# Patient Record
Sex: Male | Born: 1990 | Hispanic: Yes | Marital: Married | Smoking: Current every day smoker
Health system: Southern US, Community
[De-identification: ages and names within clinical notes are randomized; demographics above are authoritative.]

## PROBLEM LIST (undated history)

## (undated) DIAGNOSIS — G8929 Other chronic pain: Secondary | ICD-10-CM

## (undated) HISTORY — DX: Other chronic pain: G89.29

## (undated) HISTORY — PX: APPENDECTOMY: SHX54

---

## 2018-07-08 ENCOUNTER — Encounter (HOSPITAL_COMMUNITY): Payer: Self-pay | Admitting: *Deleted

## 2018-07-08 ENCOUNTER — Emergency Department (HOSPITAL_COMMUNITY)
Admission: EM | Admit: 2018-07-08 | Discharge: 2018-07-08 | Disposition: A | Discharge status: Home / Self Care | Payer: Self-pay | Attending: Emergency Medicine | Admitting: Emergency Medicine

## 2018-07-08 ENCOUNTER — Emergency Department (HOSPITAL_COMMUNITY): Payer: Self-pay

## 2018-07-08 DIAGNOSIS — F17200 Nicotine dependence, unspecified, uncomplicated: Secondary | ICD-10-CM | POA: Insufficient documentation

## 2018-07-08 DIAGNOSIS — Y929 Unspecified place or not applicable: Secondary | ICD-10-CM | POA: Insufficient documentation

## 2018-07-08 DIAGNOSIS — Y990 Civilian activity done for income or pay: Secondary | ICD-10-CM | POA: Insufficient documentation

## 2018-07-08 DIAGNOSIS — Z23 Encounter for immunization: Secondary | ICD-10-CM | POA: Insufficient documentation

## 2018-07-08 DIAGNOSIS — Y93K9 Activity, other involving animal care: Secondary | ICD-10-CM | POA: Insufficient documentation

## 2018-07-08 DIAGNOSIS — S61217A Laceration without foreign body of left little finger without damage to nail, initial encounter: Secondary | ICD-10-CM | POA: Insufficient documentation

## 2018-07-08 DIAGNOSIS — W3189XA Contact with other specified machinery, initial encounter: Secondary | ICD-10-CM | POA: Insufficient documentation

## 2018-07-08 MED ORDER — BUPIVACAINE HCL (PF) 0.5 % IJ SOLN
30.0000 mL | Freq: Once | INTRAMUSCULAR | Status: AC
  Administered 2018-07-08: 30 mL
  Filled 2018-07-08: qty 30

## 2018-07-08 MED ORDER — CEFAZOLIN SODIUM-DEXTROSE 2-4 GM/100ML-% IV SOLN
2.0000 g | Freq: Once | INTRAVENOUS | Status: AC
  Administered 2018-07-08: 2 g via INTRAVENOUS
  Filled 2018-07-08: qty 100

## 2018-07-08 MED ORDER — BUPIVACAINE HCL 0.5 % IJ SOLN: 50.0000 mL | Freq: Once | INTRAMUSCULAR | Status: DC

## 2018-07-08 MED ORDER — DOXYCYCLINE HYCLATE 100 MG PO CAPS: 100.0000 mg | ORAL_CAPSULE | Freq: Two times a day (BID) | ORAL | 0 refills | Status: DC

## 2018-07-08 MED ORDER — LIDOCAINE HCL 2 % IJ SOLN
20.0000 mL | Freq: Once | INTRAMUSCULAR | Status: AC
  Administered 2018-07-08: 400 mg
  Filled 2018-07-08: qty 20

## 2018-07-08 MED ORDER — TETANUS-DIPHTH-ACELL PERTUSSIS 5-2.5-18.5 LF-MCG/0.5 IM SUSP
0.5000 mL | Freq: Once | INTRAMUSCULAR | Status: AC
  Administered 2018-07-08: 0.5 mL via INTRAMUSCULAR
  Filled 2018-07-08: qty 0.5

## 2018-07-08 NOTE — ED Notes (Signed)
Splint applied to L pinky finger per EDP

## 2018-07-08 NOTE — ED Triage Notes (Signed)
Pt in with injury to his left 5th finger, states it got caught in a machine at work, dressing in place on arrival, bleeding controlled

## 2018-07-08 NOTE — ED Provider Notes (Signed)
[facility] EMERGENCY DEPARTMENT Provider Note   CSN: [id number] Arrival date & time: 07/08/18  1138   History   Chief Complaint Chief Complaint  Patient presents with  . Finger Injury    HPI [facility] Wagar is a 27 y.o. male.  HPI   77 YOM presents today with complaints of finger injury. 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 He notes no loss of sensation strength or motor function. Uncertain tetanus status.   History reviewed. No pertinent past medical history.  There are no active problems to display for this patient.   History reviewed. No pertinent surgical history.      Home Medications    Prior to Admission medications   Medication Sig Start Date End Date Taking? Authorizing Provider  doxycycline (VIBRAMYCIN) 100 MG capsule Take 1 capsule (100 mg total) by mouth 2 (two) times daily. 07/08/18   Eyvonne Mechanic, PA-C    Family History History reviewed. No pertinent family history.  Social History Social History   Tobacco Use  . Smoking status: Current Every Day Smoker  . Smokeless tobacco: Never Used  Substance Use Topics  . Alcohol use: Not on file  . Drug use: Not on file     Allergies   Patient has no known allergies.   Review of Systems Review of Systems  All other systems reviewed and are negative.    Physical Exam Updated Vital Signs BP (!) 140/103   Pulse 76   Temp 98 F (36.7 C) (Oral)   Resp 18   SpO2 100%   Physical Exam  Constitutional: He is oriented to person, place, and time. He appears well-developed and well-nourished.  HENT:  Head: Normocephalic and atraumatic.  Eyes: Pupils are equal, round, and reactive to light. Conjunctivae are normal. Right eye exhibits no discharge. Left eye exhibits no discharge. No scleral icterus.  Neck: Normal range of motion. No JVD present. No tracheal deviation present.    Pulmonary/Chest: Effort normal. No stridor.  Musculoskeletal:  Macerated tissue noted over the left 5th dorsum finger at the PIP. Small bone fragment noted, defect noted on the medial extensor tendon- full active range of motion against resistance at the MCP, PIP and DIP- sensation intact - superficial skin loss noted at the distal pad 5th digit   Neurological: He is alert and oriented to person, place, and time. Coordination normal.  Psychiatric: He has a normal mood and affect. His behavior is normal. Judgment and thought content normal.  Nursing note and vitals reviewed.    ED Treatments / Results  Labs (all labs ordered are listed, but only abnormal results are displayed) Labs Reviewed - No data to display  EKG None  Radiology Dg Finger Little Left  Result Date: 07/08/2018 CLINICAL DATA:  Left fifth finger injury at work. EXAM: LEFT LITTLE FINGER 2+V COMPARISON:  None. FINDINGS: Crescent-shaped bone fragment is seen posterior to proximal base of fifth middle phalanx concerning for fracture. Joint spaces are intact. Dressing is noted around the finger. IMPRESSION: Crescent-shaped bone fragment seen posterior to proximal base of fifth middle phalanx concerning for fracture. Electronically Signed   By: Lupita Raider, M.D.   On: 07/08/2018 12:44    Procedures Procedures (including critical care time)  Medications Ordered in ED Medications  lidocaine (XYLOCAINE) 2 % (with pres) injection 400 mg (400 mg Infiltration Given by Other 07/08/18 1315)  bupivacaine (  MARCAINE) 0.5 % injection 30 mL (30 mLs Infiltration Given by Other 07/08/18 1330)  ceFAZolin (ANCEF) IVPB 2g/100 mL premix (0 g Intravenous Stopped 07/08/18 1448)  Tdap (BOOSTRIX) injection 0.5 mL (0.5 mLs Intramuscular Given 07/08/18 1453)     Initial Impression / Assessment and Plan / ED Course  I have reviewed the triage vital signs and the nursing notes.  Pertinent labs & imaging results that were available during my  care of the patient were reviewed by me and considered in my medical decision making (see chart for details).     Labs:   Imaging: DG finger left little   Consults:  Therapeutics: Tdap, lidocaine, bupivacaine, Ancef   Discharge Meds: Doxycycline  Assessment/Plan: 27 year old male presents today with laceration to his finger.  Patient notes to have very small amount of tendon involvement and bone fragment.  Wound was copiously irrigated and closed, patient has no loss of sensation strength and motor function of the finger.  Case was discussed with orthopedics with outpatient follow-up indicated.  Patient is given strict return precautions, wound care instructions.  Verbalized understanding and agreement to today's plan.  Interpreter was used.     Final Clinical Impressions(s) / ED Diagnoses   Final diagnoses:  Laceration of left little finger without foreign body without damage to nail, initial encounter    ED Discharge Orders         Ordered    doxycycline (VIBRAMYCIN) 100 MG capsule  2 times daily     07/08/18 1539           Nyesha Cliff, Tinnie Gens, PA-C 07/08/18 1557    Derwood Kaplan, MD 07/11/18 1504

## 2018-07-08 NOTE — Discharge Instructions (Signed)
Please read attached information. If you experience any new or worsening signs or symptoms please return to the emergency room for evaluation. Please follow-up with your primary care provider or specialist as discussed. Please use medication prescribed only as directed and discontinue taking if you have any concerning signs or symptoms.   °

## 2019-12-03 IMAGING — CR DG FINGER LITTLE 2+V*L*
3 series · 3 of 3 positions shown · non-contrast
Comparison: None.

CLINICAL DATA: Left fifth finger injury at work.

EXAM:
LEFT LITTLE FINGER 2+V

[finger ap]
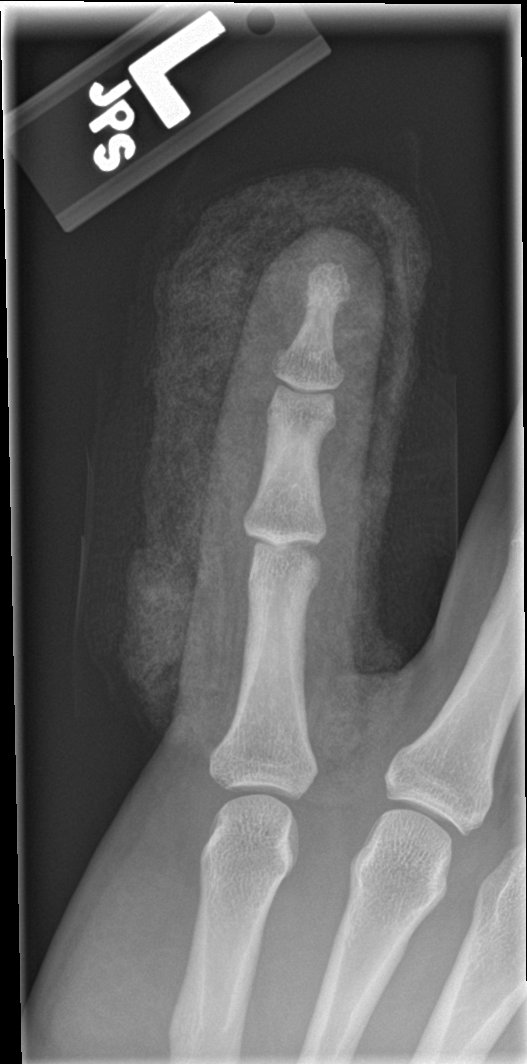

[finger obl]
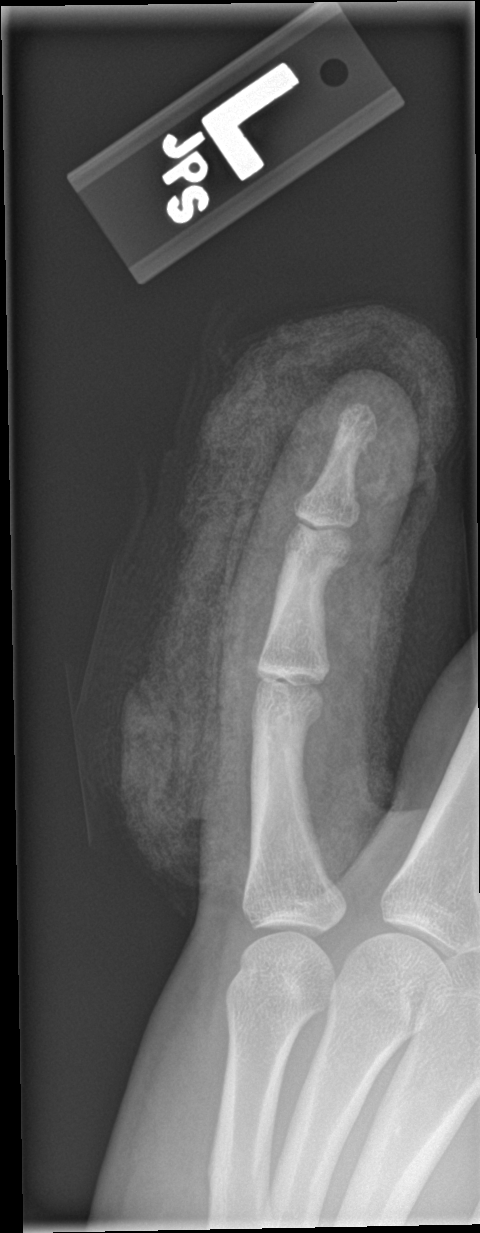

[finger lat]
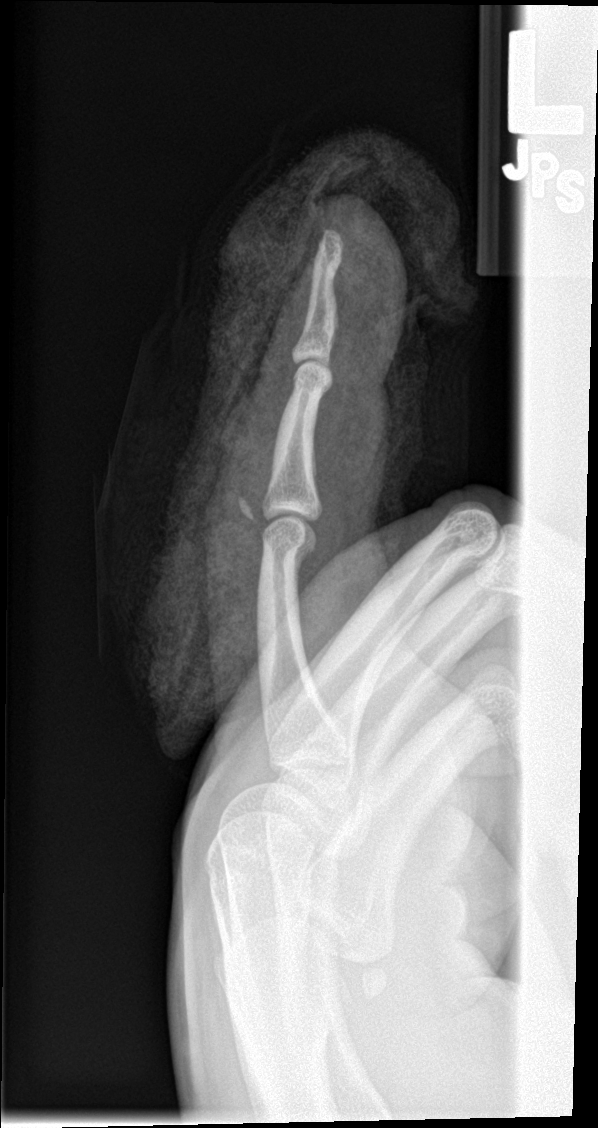

[3 of 3 positions shown; findings below may reference images not displayed]

FINDINGS: Crescent-shaped bone fragment is seen posterior to proximal base of
fifth middle phalanx concerning for fracture. Joint spaces are
intact. Dressing is noted around the finger.
IMPRESSION: Crescent-shaped bone fragment seen posterior to proximal base of
fifth middle phalanx concerning for fracture.

## 2022-09-09 ENCOUNTER — Inpatient Hospital Stay (HOSPITAL_COMMUNITY)
Admission: EM | Admit: 2022-09-09 | Discharge: 2022-09-13 | DRG: 519 | Disposition: A | Discharge status: Home / Self Care | Payer: Self-pay | Attending: Neurosurgery | Admitting: Neurosurgery

## 2022-09-09 ENCOUNTER — Encounter (HOSPITAL_COMMUNITY): Payer: Self-pay

## 2022-09-09 ENCOUNTER — Other Ambulatory Visit: Payer: Self-pay

## 2022-09-09 DIAGNOSIS — Z6841 Body Mass Index (BMI) 40.0 and over, adult: Secondary | ICD-10-CM

## 2022-09-09 DIAGNOSIS — M5136 Other intervertebral disc degeneration, lumbar region: Principal | ICD-10-CM

## 2022-09-09 DIAGNOSIS — M5416 Radiculopathy, lumbar region: Secondary | ICD-10-CM

## 2022-09-09 DIAGNOSIS — F17210 Nicotine dependence, cigarettes, uncomplicated: Secondary | ICD-10-CM | POA: Diagnosis present

## 2022-09-09 DIAGNOSIS — M5116 Intervertebral disc disorders with radiculopathy, lumbar region: Principal | ICD-10-CM | POA: Diagnosis present

## 2022-09-09 DIAGNOSIS — M51369 Other intervertebral disc degeneration, lumbar region without mention of lumbar back pain or lower extremity pain: Secondary | ICD-10-CM

## 2022-09-09 DIAGNOSIS — G8929 Other chronic pain: Secondary | ICD-10-CM | POA: Diagnosis present

## 2022-09-09 DIAGNOSIS — Z79891 Long term (current) use of opiate analgesic: Secondary | ICD-10-CM

## 2022-09-09 DIAGNOSIS — M549 Dorsalgia, unspecified: Secondary | ICD-10-CM

## 2022-09-09 DIAGNOSIS — Z79899 Other long term (current) drug therapy: Secondary | ICD-10-CM

## 2022-09-09 MED ORDER — NALOXONE HCL 0.4 MG/ML IJ SOLN: 0.4000 mg | Freq: Once | INTRAMUSCULAR | Status: DC

## 2022-09-09 MED ORDER — HYDROCODONE-ACETAMINOPHEN 5-325 MG PO TABS
1.0000 | ORAL_TABLET | Freq: Once | ORAL | Status: AC
  Administered 2022-09-09: 1 via ORAL
  Filled 2022-09-09: qty 1

## 2022-09-09 MED ORDER — LIDOCAINE 5 % EX PTCH
1.0000 | MEDICATED_PATCH | CUTANEOUS | Status: DC
  Administered 2022-09-09 – 2022-09-12 (×4): 1 via TRANSDERMAL
  Filled 2022-09-09 (×5): qty 1

## 2022-09-09 NOTE — ED Provider Triage Note (Signed)
Emergency Medicine Provider Triage Evaluation Note  [facility] Marc York , a 31 y.o. male  was evaluated in triage.  Pt complains of chronic back pain with worsening pain for the last 3 days. States pain is unbearable. Primarily affects right buttocks and leg. No loss of bowel, bladder, fever, or chills. No recent trauma. On ibuprofen, flexeril, w/o relief.  Review of Systems  Positive: Leg pain, buttocks pain Negative: Loss of bowel, bladder  Physical Exam  BP (!) 134/96   Pulse 92   Temp 98.2 F (36.8 C) (Oral)   Resp 12   SpO2 98%  Gen:   Awake, no distress   Resp:  Normal effort  MSK:   Moves extremities without difficulty  Other:  +R SLR  Medical Decision Making  Medically screening exam initiated at 3:47 PM.  Appropriate orders placed.  [facility] Marc York was informed that the remainder of the evaluation will be completed by another provider, this initial triage assessment does not replace that evaluation, and the importance of remaining in the ED until their evaluation is complete.    Pete Pelt, [place] 09/09/22 1548

## 2022-09-09 NOTE — ED Triage Notes (Signed)
Per pt's wife who is interpreting for the patient. Pt sees [facility] for chronic low back pain.   Today, pt is in so much pain he can't walk. Pain starts at the right lower back and extends down the lateral aspect of the leg to his foot. Pt has taken his pain regimen that is prescribed to him with no relief. Denies new injury.  Imaging last done in 02/2022.

## 2022-09-10 ENCOUNTER — Emergency Department (HOSPITAL_COMMUNITY): Payer: Self-pay

## 2022-09-10 DIAGNOSIS — M549 Dorsalgia, unspecified: Secondary | ICD-10-CM | POA: Diagnosis present

## 2022-09-10 DIAGNOSIS — M5416 Radiculopathy, lumbar region: Secondary | ICD-10-CM | POA: Diagnosis present

## 2022-09-10 LAB — CBC
HCT: 46 % (ref 39.0–52.0)
Hemoglobin: 16.6 g/dL (ref 13.0–17.0)
MCH: 32.2 pg (ref 26.0–34.0)
MCHC: 36.1 g/dL — ABNORMAL HIGH (ref 30.0–36.0)
MCV: 89.1 fL (ref 80.0–100.0)
Platelets: 261 10*3/uL (ref 150–400)
RBC: 5.16 MIL/uL (ref 4.22–5.81)
RDW: 12.7 % (ref 11.5–15.5)
WBC: 12.1 10*3/uL — ABNORMAL HIGH (ref 4.0–10.5)
nRBC: 0 % (ref 0.0–0.2)

## 2022-09-10 LAB — BASIC METABOLIC PANEL
Anion gap: 11 (ref 5–15)
BUN: 13 mg/dL (ref 6–20)
CO2: 24 mmol/L (ref 22–32)
Calcium: 9.9 mg/dL (ref 8.9–10.3)
Chloride: 102 mmol/L (ref 98–111)
Creatinine, Ser: 1.05 mg/dL (ref 0.61–1.24)
GFR, Estimated: >60 mL/min (ref >60)
Glucose, Bld: 119 mg/dL — ABNORMAL HIGH (ref 70–99)
Potassium: 4 mmol/L (ref 3.5–5.1)
Sodium: 137 mmol/L (ref 135–145)

## 2022-09-10 MED ORDER — HYDROMORPHONE HCL 1 MG/ML IJ SOLN
1.0000 mg | Freq: Once | INTRAMUSCULAR | Status: AC
  Administered 2022-09-10: 1 mg via INTRAVENOUS
  Filled 2022-09-10: qty 1

## 2022-09-10 MED ORDER — CYCLOBENZAPRINE HCL 10 MG PO TABS
10.0000 mg | ORAL_TABLET | Freq: Three times a day (TID) | ORAL | Status: DC | PRN
  Filled 2022-09-10: qty 1

## 2022-09-10 MED ORDER — DOCUSATE SODIUM 100 MG PO CAPS
100.0000 mg | ORAL_CAPSULE | Freq: Two times a day (BID) | ORAL | Status: DC
  Filled 2022-09-10 (×4): qty 1

## 2022-09-10 MED ORDER — ACETAMINOPHEN 325 MG PO TABS: 650.0000 mg | ORAL_TABLET | ORAL | Status: DC | PRN

## 2022-09-10 MED ORDER — ONDANSETRON HCL 4 MG/2ML IJ SOLN: 4.0000 mg | Freq: Four times a day (QID) | INTRAMUSCULAR | Status: DC | PRN

## 2022-09-10 MED ORDER — CELECOXIB 200 MG PO CAPS
200.0000 mg | ORAL_CAPSULE | Freq: Two times a day (BID) | ORAL | Status: DC
  Administered 2022-09-10 – 2022-09-13 (×6): 200 mg via ORAL
  Filled 2022-09-10 (×7): qty 1

## 2022-09-10 MED ORDER — ACETAMINOPHEN 650 MG RE SUPP: 650.0000 mg | RECTAL | Status: DC | PRN

## 2022-09-10 MED ORDER — SODIUM CHLORIDE 0.9% FLUSH: 3.0000 mL | INTRAVENOUS | Status: DC | PRN

## 2022-09-10 MED ORDER — POLYETHYLENE GLYCOL 3350 17 G PO PACK: 17.0000 g | PACK | Freq: Every day | ORAL | Status: DC | PRN

## 2022-09-10 MED ORDER — HYDROMORPHONE HCL 1 MG/ML IJ SOLN: 1.0000 mg | INTRAMUSCULAR | Status: DC | PRN

## 2022-09-10 MED ORDER — SODIUM CHLORIDE 0.9 % IV SOLN
250.0000 mL | INTRAVENOUS | Status: DC
  Administered 2022-09-10: 250 mL via INTRAVENOUS

## 2022-09-10 MED ORDER — OXYCODONE HCL 5 MG PO TABS
5.0000 mg | ORAL_TABLET | ORAL | Status: DC | PRN
  Administered 2022-09-11: 5 mg via ORAL
  Filled 2022-09-10 (×2): qty 1

## 2022-09-10 MED ORDER — ONDANSETRON HCL 4 MG PO TABS: 4.0000 mg | ORAL_TABLET | Freq: Four times a day (QID) | ORAL | Status: DC | PRN

## 2022-09-10 MED ORDER — MENTHOL 3 MG MT LOZG: 1.0000 | LOZENGE | OROMUCOSAL | Status: DC | PRN

## 2022-09-10 MED ORDER — PHENOL 1.4 % MT LIQD: 1.0000 | OROMUCOSAL | Status: DC | PRN

## 2022-09-10 MED ORDER — ENOXAPARIN SODIUM 40 MG/0.4ML IJ SOSY
40.0000 mg | PREFILLED_SYRINGE | INTRAMUSCULAR | Status: DC
  Administered 2022-09-11 – 2022-09-13 (×2): 40 mg via SUBCUTANEOUS
  Filled 2022-09-10 (×2): qty 0.4

## 2022-09-10 MED ORDER — SODIUM CHLORIDE 0.9% FLUSH
3.0000 mL | Freq: Two times a day (BID) | INTRAVENOUS | Status: DC
  Administered 2022-09-10 – 2022-09-11 (×4): 3 mL via INTRAVENOUS

## 2022-09-10 MED ORDER — FLEET ENEMA 7-19 GM/118ML RE ENEM: 1.0000 | ENEMA | Freq: Once | RECTAL | Status: DC | PRN

## 2022-09-10 MED ORDER — HYDROCODONE-ACETAMINOPHEN 5-325 MG PO TABS
1.0000 | ORAL_TABLET | Freq: Once | ORAL | Status: AC
  Administered 2022-09-10: 1 via ORAL
  Filled 2022-09-10: qty 1

## 2022-09-10 MED ORDER — PREDNISONE 20 MG PO TABS
60.0000 mg | ORAL_TABLET | Freq: Once | ORAL | Status: AC
  Administered 2022-09-10: 60 mg via ORAL
  Filled 2022-09-10: qty 3

## 2022-09-10 NOTE — ED Notes (Signed)
PT well appearing upon transport to floor.  

## 2022-09-10 NOTE — ED Notes (Signed)
This RN assumed care of patient. He denies any unmet needs at this time and NAD noted. Transfer of care report given by previous RN to inpatient RN, Waiting for floor to accept patient.

## 2022-09-10 NOTE — H&P (Signed)
CC: back and right leg pain  HPI:     Patient is a 31 y.o. male presents with worsening right leg sciatica.  He has had back and pain radiating down his right leg for the past several months.  PT and pain management failed to resolve his symptoms.  He then went to [facility] and received an ESI which helped only for a few days. He came to the ER when he felt more weakness in his leg.    Patient Active Problem List   Diagnosis Date Noted   Back pain 09/10/2022   Lumbar radiculopathy 09/10/2022   Past Medical History:  Diagnosis Date   Chronic back pain     History reviewed. No pertinent surgical history.  Medications Prior to Admission  Medication Sig Dispense Refill Last Dose   cyclobenzaprine (FLEXERIL) 10 MG tablet 10 mg 2 (two) times daily as needed for muscle spasms.   09/09/2022   gabapentin (NEURONTIN) 300 MG capsule Take 300 mg by mouth 2 (two) times daily.   09/09/2022   ibuprofen (ADVIL) 600 MG tablet Take 600 mg by mouth every 8 (eight) hours as needed for moderate pain.   09/09/2022   traMADol (ULTRAM) 50 MG tablet Take 50 mg by mouth 2 (two) times daily as needed for moderate pain.   09/09/2022   No Known Allergies  Social History   Tobacco Use   Smoking status: Every Day   Smokeless tobacco: Never  Substance Use Topics   Alcohol use: Not Currently    History reviewed. No pertinent family history.   Review of Systems Pertinent items noted in HPI and remainder of comprehensive ROS otherwise negative.  Objective:   Patient Vitals for the past 8 hrs:  BP Temp Temp src Pulse Resp SpO2  09/10/22 1015 (!) 140/83 98.5 F (36.9 C) -- 75 18 97 %  09/10/22 0407 139/80 98.2 F (36.8 C) Oral 90 18 99 %   No intake/output data recorded. No intake/output data recorded.      General : Alert, cooperative, no distress, appears stated age. obese   Head:  Normocephalic/atraumatic    Eyes: PERRL, conjunctiva/corneas clear, EOM's intact. Fundi could not be  visualized Neck: Supple Chest:  Respirations unlabored Chest wall: no tenderness or deformity Heart: Regular rate and rhythm Abdomen: Soft, nontender and nondistended Extremities: warm and well-perfused Skin: normal turgor, color and texture Neurologic:  Alert, oriented x 3.  Eyes open spontaneously. PERRL, EOMI, VFC, no facial droop. V1-3 intact.  No dysarthria, tongue protrusion symmetric.  CNII-XII intact. Normal strength, sensation and reflexes throughout.  No pronator drift, full strength in legs, except R DF 4+/5, R EHL 4-/5.  + R SLR       Data ReviewCBC:  Lab Results  Component Value Date   WBC 12.1 (H) 09/10/2022   RBC 5.16 09/10/2022   BMP:  Lab Results  Component Value Date   GLUCOSE 119 (H) 09/10/2022   CO2 24 09/10/2022   BUN 13 09/10/2022   CREATININE 1.05 09/10/2022   CALCIUM 9.9 09/10/2022   Radiology review:   MRI L spine reviewed.  Bulging disc at L3-4 with mild/moderate stenosis.  Large L4-5 broad R paracentral disc herniation with inferior extrusion and severe nerve impingement.  Assessment:   Principal Problem:   Back pain Active Problems:   Lumbar radiculopathy   Plan:  -  I had a long discussion with the patient and his wife.  Treatment options were discussed.  They wished to  proceed with microdiscectomy.  I made them aware that [facility] has low OR availability for inpatient surgeries so it may be several days before it can be scheduled.  Risks, benefits, alternatives, and expected convalescence were discussed with her.  Risks discussed included, but were not limited to bleeding, pain, infection, scar, spinal fluid leak, neurologic deficit, instability, damage to nearby organs, and death.  Informed consent was obtained.

## 2022-09-10 NOTE — Plan of Care (Signed)
  Problem: Education: Goal: Knowledge of General Education information will improve Description: Including pain rating scale, medication(s)/side effects and non-pharmacologic comfort measures Outcome: Progressing   Problem: Activity: Goal: Risk for activity intolerance will decrease Outcome: Progressing   Problem: Nutrition: Goal: Adequate nutrition will be maintained Outcome: Progressing   Problem: Elimination: Goal: Will not experience complications related to bowel motility Outcome: Progressing   Problem: Pain Managment: Goal: General experience of comfort will improve Outcome: Progressing   

## 2022-09-10 NOTE — ED Notes (Signed)
Pt family came to see what was the current wait time stating that the pt was still in a lot of pain and earlier intervention did not help. Wait was explained tp pts family. PA was notified

## 2022-09-10 NOTE — ED Provider Notes (Signed)
[facility] EMERGENCY DEPARTMENT Provider Note   CSN: [id number] Arrival date & time: 09/09/22  1351     History Chief Complaint  Patient presents with   Back Pain    Marc York is a 31 y.o. male.   Back Pain Associated symptoms: numbness and weakness   Associated symptoms: no abdominal pain, no dysuria and no fever   Patient presents to the ER with worsening radiating low back pain. Patient states that pain originally began about 6 months ago after a fall with resolution in symptoms after course of antiinflammatories. Patient states that current low back pain has been worsening for several days but worsened significantly this morning with no relief in symptoms even with use of prescribed medications for pain control. Taking ibuprofen, gabapentin, cyclobenzaprine, and diclofenac. Denies fever, abdominal pain, urinary symptoms, loss of bowel or bladder control, or saddle paraesthesia.   Home Medications Prior to Admission medications   Medication Sig Start Date End Date Taking? Authorizing Provider  cyclobenzaprine (FLEXERIL) 10 MG tablet 10 mg 2 (two) times daily as needed for muscle spasms. 08/09/22  Yes [provider]  gabapentin (NEURONTIN) 300 MG capsule Take 300 mg by mouth 2 (two) times daily. 07/08/22  Yes [provider]  ibuprofen (ADVIL) 600 MG tablet Take 600 mg by mouth every 8 (eight) hours as needed for moderate pain.   Yes [provider]  traMADol (ULTRAM) 50 MG tablet Take 50 mg by mouth 2 (two) times daily as needed for moderate pain. 07/30/16  Yes [provider]      Allergies    Patient has no known allergies.    Review of Systems   Review of Systems  Constitutional:  Negative for chills and fever.  Cardiovascular:  Negative for leg swelling.  Gastrointestinal:  Negative for abdominal pain and nausea.  Genitourinary:  Negative for dysuria and hematuria.  Musculoskeletal:  Positive  for back pain.  Neurological:  Positive for weakness and numbness.  All other systems reviewed and are negative.   Physical Exam Updated Vital Signs BP 139/80   Pulse 90   Temp 98.2 F (36.8 C) (Oral)   Resp 18   SpO2 99%  Physical Exam Vitals and nursing note reviewed.  Constitutional:      Appearance: Normal appearance.  HENT:     Head: Normocephalic and atraumatic.  Eyes:     Conjunctiva/sclera: Conjunctivae normal.  Cardiovascular:     Rate and Rhythm: Normal rate and regular rhythm.     Pulses: Normal pulses.     Heart sounds: Normal heart sounds.  Pulmonary:     Effort: Pulmonary effort is normal.     Breath sounds: Normal breath sounds.  Musculoskeletal:        General: Tenderness present. No swelling or deformity.     Cervical back: Normal range of motion.     Right lower leg: No edema.     Left lower leg: No edema.  Skin:    General: Skin is warm and dry.  Neurological:     Mental Status: He is alert.     Sensory: Sensory deficit present.     Motor: Weakness present.     Deep Tendon Reflexes: Reflexes abnormal.     Reflex Scores:      Patellar reflexes are 2+ on the right side and 2+ on the left side.      Achilles reflexes are 1+ on the right side and 2+ on the left side.  Comments: Decreased strength in right leg compared to left. Notable decreased strength on resisted leg extension/flexion, plantarflexion. Patient unable to stand on toes with right foot. Left leg/foot normal.     ED Results / Procedures / Treatments   Labs (all labs ordered are listed, but only abnormal results are displayed) Labs Reviewed  CBC - Abnormal; Notable for the following components:      Result Value   WBC 12.1 (*)    MCHC 36.1 (*)    All other components within normal limits  BASIC METABOLIC PANEL - Abnormal; Notable for the following components:   Glucose, Bld 119 (*)    All other components within normal limits    EKG None  Radiology MR LUMBAR SPINE WO  CONTRAST  Result Date: 09/10/2022 CLINICAL DATA:  Acute lumbar myelopathy. Right leg pain extending to the foot EXAM: MRI LUMBAR SPINE WITHOUT CONTRAST TECHNIQUE: Multiplanar, multisequence MR imaging of the lumbar spine was performed. No intravenous contrast was administered. COMPARISON:  None Available. FINDINGS: Segmentation:  Standard. Alignment:  Reversal of lumbar lordosis Vertebrae:  No fracture, evidence of discitis, or bone lesion. Conus medullaris and cauda equina: Conus extends to the T12-L1 level. Conus and cauda equina appear normal. Paraspinal and other soft tissues: Negative for perispinal mass or inflammation Disc levels: T12- L1: Unremarkable. L1-L2: Central herniation without cauda equina compression L2-L3: Disc space narrowing and circumferential bulging. L3-L4: Disc narrowing and bulging with bilateral foraminal and right paracentral herniation. The right paracentral herniation could affect the descending right L4 nerve root. Patent foramina with mild to moderate spinal stenosis L4-L5: Disc narrowing and bulging with right paracentral downward migrating extrusion markedly compressing the right L5 nerve root. Compression of the thecal sac. Patent foramina L5-S1:Small central protrusion which is noncompressive. IMPRESSION: 1. Multilevel disc degeneration and herniation as described. 2. L4-5 right paracentral extrusion with downward migration and marked right L5 impingement. Thecal sac compression at this level. 3. L3-4 right paracentral herniation that could affect the right L4 nerve root. 4. Diffusely patent foramina. Electronically Signed   By: Tiburcio Pea M.D.   On: 09/10/2022 04:37    Procedures Procedures   Medications Ordered in ED Medications  lidocaine (LIDODERM) 5 % 1 patch (1 patch Transdermal Patch Applied 09/09/22 1616)  sodium chloride flush (NS) 0.9 % injection 3 mL (has no administration in time range)  sodium chloride flush (NS) 0.9 % injection 3 mL (has no  administration in time range)  0.9 %  sodium chloride infusion (250 mLs Intravenous New Bag/Given 09/10/22 3419)  acetaminophen (TYLENOL) tablet 650 mg (has no administration in time range)    Or  acetaminophen (TYLENOL) suppository 650 mg (has no administration in time range)  ondansetron (ZOFRAN) tablet 4 mg (has no administration in time range)    Or  ondansetron (ZOFRAN) injection 4 mg (has no administration in time range)  menthol-cetylpyridinium (CEPACOL) lozenge 3 mg (has no administration in time range)    Or  phenol (CHLORASEPTIC) mouth spray 1 spray (has no administration in time range)  enoxaparin (LOVENOX) injection 40 mg (has no administration in time range)  celecoxib (CELEBREX) capsule 200 mg (has no administration in time range)  oxyCODONE (Oxy IR/ROXICODONE) immediate release tablet 5 mg (has no administration in time range)  HYDROmorphone (DILAUDID) injection 1 mg (has no administration in time range)  cyclobenzaprine (FLEXERIL) tablet 10 mg (has no administration in time range)  docusate sodium (COLACE) capsule 100 mg (has no administration in time range)  polyethylene glycol (MIRALAX /  GLYCOLAX) packet 17 g (has no administration in time range)  sodium phosphate (FLEET) 7-19 GM/118ML enema 1 enema (has no administration in time range)  HYDROcodone-acetaminophen (NORCO/VICODIN) 5-325 MG per tablet 1 tablet (1 tablet Oral Given 09/09/22 1616)  HYDROcodone-acetaminophen (NORCO/VICODIN) 5-325 MG per tablet 1 tablet (1 tablet Oral Given 09/10/22 0102)  HYDROmorphone (DILAUDID) injection 1 mg (1 mg Intravenous Given 09/10/22 0358)  predniSONE (DELTASONE) tablet 60 mg (60 mg Oral Given 09/10/22 0358)    ED Course/ Medical Decision Making/ A&P                           Medical Decision Making Amount and/or Complexity of Data Reviewed Labs: ordered. Radiology: ordered.  Risk Prescription drug management. Decision regarding hospitalization.   This patient presents to  the ED for concern of low back pain with numbness. Differential diagnosis includes lumbar radiculopathy, spinal abscess, cauda equina syndrome, spinal stenosis, neuropathy   Additional history obtained:  Additional history obtained from wife\ Patient's wife provided a copy of MRI from 6 months ago on disc that could not be viewed.   Imaging Studies ordered:  I ordered imaging studies including MRI lumbar spine  I independently visualized and interpreted imaging which showed L4-5 right extrusion with L5 impingement. L3-4 right paracentral herniation I agree with the radiologist interpretation   Medicines ordered and prescription drug management:  I ordered medication including Dilaudid and prednisone for low back pain with radiculopathy Reevaluation of the patient after these medicines showed that the patient improved I have reviewed the patients home medicines and have made adjustments as needed   Problem List / ED Course:  Patient presented to the ER due to severe low back pain with numbness. Patient reports that symptoms originally began about 6 months ago with 6-8 week period of similar pain that eventually resolved with use of antiinflammatories. Patient's current episode of back pain and numbness has been worsening over the past 3 days and does not feel that current medications prescribed by [facility] are helping symptoms. Exam was concerning for right leg weakness noted on leg extension/flexion as well as significantly strength on plantarflexion with inability to stand on toes on right side with all of these findings difficult to distinguish if limited by pain or truly a neurological deficit. Dr. Pilar Plate consulted neurosurgery which requested inpatient admission for further evaluation and possible microdiscectomy. All of this was communicated to patient and he verbalized understanding.    Final Clinical Impression(s) / ED Diagnoses Final diagnoses:  Bulging of  intervertebral disc between L4 and L5  Lumbar radiculopathy    Rx / DC Orders ED Discharge Orders          Ordered    Incentive spirometry RT        09/10/22 0540              Smitty Knudsen, PA-C 09/10/22 0732    Sabas Sous, MD 09/11/22 [phone]

## 2022-09-11 LAB — SURGICAL PCR SCREEN
MRSA, PCR: NEGATIVE
Staphylococcus aureus: NEGATIVE

## 2022-09-11 NOTE — Progress Notes (Signed)
Neurosurgery  Pt seen and examined.  No change in symptoms.  - plan for microdiscectomy tomorrow

## 2022-09-12 ENCOUNTER — Other Ambulatory Visit: Payer: Self-pay

## 2022-09-12 ENCOUNTER — Encounter (HOSPITAL_COMMUNITY)
Admission: EM | Disposition: A | Discharge status: Home / Self Care | Payer: Self-pay | Source: Home / Self Care | Attending: Neurosurgery

## 2022-09-12 ENCOUNTER — Inpatient Hospital Stay (HOSPITAL_COMMUNITY): Payer: Self-pay | Admitting: Certified Registered Nurse Anesthetist

## 2022-09-12 ENCOUNTER — Encounter (HOSPITAL_COMMUNITY): Payer: Self-pay | Admitting: Neurosurgery

## 2022-09-12 ENCOUNTER — Inpatient Hospital Stay (HOSPITAL_COMMUNITY): Payer: Self-pay

## 2022-09-12 DIAGNOSIS — M5116 Intervertebral disc disorders with radiculopathy, lumbar region: Secondary | ICD-10-CM

## 2022-09-12 DIAGNOSIS — Z6841 Body Mass Index (BMI) 40.0 and over, adult: Secondary | ICD-10-CM

## 2022-09-12 DIAGNOSIS — F17210 Nicotine dependence, cigarettes, uncomplicated: Secondary | ICD-10-CM

## 2022-09-12 HISTORY — PX: LUMBAR LAMINECTOMY/ DECOMPRESSION WITH MET-RX: SHX5959

## 2022-09-12 LAB — TYPE AND SCREEN
ABO/RH(D): A POS
Antibody Screen: NEGATIVE

## 2022-09-12 LAB — ABO/RH: ABO/RH(D): A POS

## 2022-09-12 SURGERY — LUMBAR LAMINECTOMY/ DECOMPRESSION WITH MET-RX
Anesthesia: General | Laterality: Right

## 2022-09-12 MED ORDER — GABAPENTIN 300 MG PO CAPS
300.0000 mg | ORAL_CAPSULE | Freq: Two times a day (BID) | ORAL | Status: DC
  Administered 2022-09-12 – 2022-09-13 (×3): 300 mg via ORAL
  Filled 2022-09-12 (×4): qty 1

## 2022-09-12 MED ORDER — HYDROMORPHONE HCL 1 MG/ML IJ SOLN
INTRAMUSCULAR | Status: AC
  Filled 2022-09-12: qty 0.5

## 2022-09-12 MED ORDER — LIDOCAINE-EPINEPHRINE 1 %-1:100000 IJ SOLN
INTRAMUSCULAR | Status: DC | PRN
  Administered 2022-09-12: 3 mL

## 2022-09-12 MED ORDER — ONDANSETRON HCL 4 MG/2ML IJ SOLN: 4.0000 mg | Freq: Four times a day (QID) | INTRAMUSCULAR | Status: DC | PRN

## 2022-09-12 MED ORDER — SODIUM CHLORIDE 0.9% FLUSH: 3.0000 mL | INTRAVENOUS | Status: DC | PRN

## 2022-09-12 MED ORDER — DEXAMETHASONE SODIUM PHOSPHATE 10 MG/ML IJ SOLN
INTRAMUSCULAR | Status: DC | PRN
  Administered 2022-09-12: 10 mg via INTRAVENOUS

## 2022-09-12 MED ORDER — PROPOFOL 10 MG/ML IV BOLUS
INTRAVENOUS | Status: AC
  Filled 2022-09-12: qty 20

## 2022-09-12 MED ORDER — DEXAMETHASONE SODIUM PHOSPHATE 10 MG/ML IJ SOLN
INTRAMUSCULAR | Status: AC
  Filled 2022-09-12: qty 1

## 2022-09-12 MED ORDER — LIDOCAINE-EPINEPHRINE 1 %-1:100000 IJ SOLN
INTRAMUSCULAR | Status: AC
  Filled 2022-09-12: qty 1

## 2022-09-12 MED ORDER — MIDAZOLAM HCL 5 MG/5ML IJ SOLN
INTRAMUSCULAR | Status: DC | PRN
  Administered 2022-09-12: 2 mg via INTRAVENOUS

## 2022-09-12 MED ORDER — ORAL CARE MOUTH RINSE: 15.0000 mL | Freq: Once | OROMUCOSAL | Status: AC

## 2022-09-12 MED ORDER — THROMBIN 5000 UNITS EX SOLR
CUTANEOUS | Status: AC
  Filled 2022-09-12: qty 5000

## 2022-09-12 MED ORDER — BUPIVACAINE HCL 0.5 % IJ SOLN
INTRAMUSCULAR | Status: DC | PRN
  Administered 2022-09-12: 8 mL

## 2022-09-12 MED ORDER — THROMBIN 5000 UNITS EX SOLR
OROMUCOSAL | Status: DC | PRN
  Administered 2022-09-12: 5 mL via TOPICAL

## 2022-09-12 MED ORDER — CEFAZOLIN SODIUM-DEXTROSE 2-4 GM/100ML-% IV SOLN
2.0000 g | INTRAVENOUS | Status: DC
  Filled 2022-09-12: qty 100

## 2022-09-12 MED ORDER — MIDAZOLAM HCL 2 MG/2ML IJ SOLN
INTRAMUSCULAR | Status: AC
  Filled 2022-09-12: qty 2

## 2022-09-12 MED ORDER — ONDANSETRON HCL 4 MG/2ML IJ SOLN
INTRAMUSCULAR | Status: DC | PRN
  Administered 2022-09-12: 4 mg via INTRAVENOUS

## 2022-09-12 MED ORDER — PHENOL 1.4 % MT LIQD: 1.0000 | OROMUCOSAL | Status: DC | PRN

## 2022-09-12 MED ORDER — ACETAMINOPHEN 10 MG/ML IV SOLN
INTRAVENOUS | Status: DC | PRN
  Administered 2022-09-12: 1000 mg via INTRAVENOUS

## 2022-09-12 MED ORDER — METHYLPREDNISOLONE ACETATE 80 MG/ML IJ SUSP
INTRAMUSCULAR | Status: AC
  Filled 2022-09-12: qty 1

## 2022-09-12 MED ORDER — DEXMEDETOMIDINE HCL IN NACL 80 MCG/20ML IV SOLN
INTRAVENOUS | Status: AC
  Filled 2022-09-12: qty 20

## 2022-09-12 MED ORDER — HYDROMORPHONE HCL 1 MG/ML IJ SOLN: 0.5000 mg | INTRAMUSCULAR | Status: DC | PRN

## 2022-09-12 MED ORDER — POTASSIUM CHLORIDE IN NACL 20-0.9 MEQ/L-% IV SOLN
INTRAVENOUS | Status: DC
  Filled 2022-09-12: qty 1000

## 2022-09-12 MED ORDER — CHLORHEXIDINE GLUCONATE 0.12 % MT SOLN: 15.0000 mL | Freq: Once | OROMUCOSAL | Status: AC

## 2022-09-12 MED ORDER — FENTANYL CITRATE (PF) 250 MCG/5ML IJ SOLN
INTRAMUSCULAR | Status: AC
  Filled 2022-09-12: qty 5

## 2022-09-12 MED ORDER — ACETAMINOPHEN 650 MG RE SUPP: 650.0000 mg | RECTAL | Status: DC | PRN

## 2022-09-12 MED ORDER — SODIUM CHLORIDE 0.9 % IV SOLN: 250.0000 mL | INTRAVENOUS | Status: DC

## 2022-09-12 MED ORDER — MENTHOL 3 MG MT LOZG: 1.0000 | LOZENGE | OROMUCOSAL | Status: DC | PRN

## 2022-09-12 MED ORDER — ACETAMINOPHEN 325 MG PO TABS: 650.0000 mg | ORAL_TABLET | ORAL | Status: DC | PRN

## 2022-09-12 MED ORDER — HYDROMORPHONE HCL 1 MG/ML IJ SOLN
INTRAMUSCULAR | Status: DC | PRN
  Administered 2022-09-12: .5 mg via INTRAVENOUS

## 2022-09-12 MED ORDER — FENTANYL CITRATE (PF) 250 MCG/5ML IJ SOLN
INTRAMUSCULAR | Status: DC | PRN
  Administered 2022-09-12: 100 ug via INTRAVENOUS
  Administered 2022-09-12 (×3): 50 ug via INTRAVENOUS

## 2022-09-12 MED ORDER — SODIUM CHLORIDE 0.9% FLUSH
3.0000 mL | Freq: Two times a day (BID) | INTRAVENOUS | Status: DC
  Administered 2022-09-12 – 2022-09-13 (×3): 3 mL via INTRAVENOUS

## 2022-09-12 MED ORDER — DOCUSATE SODIUM 100 MG PO CAPS
100.0000 mg | ORAL_CAPSULE | Freq: Two times a day (BID) | ORAL | Status: DC
  Administered 2022-09-12 – 2022-09-13 (×2): 100 mg via ORAL
  Filled 2022-09-12 (×2): qty 1

## 2022-09-12 MED ORDER — ROCURONIUM BROMIDE 10 MG/ML (PF) SYRINGE
PREFILLED_SYRINGE | INTRAVENOUS | Status: DC | PRN
  Administered 2022-09-12: 30 mg via INTRAVENOUS
  Administered 2022-09-12: 70 mg via INTRAVENOUS

## 2022-09-12 MED ORDER — CYCLOBENZAPRINE HCL 10 MG PO TABS: 10.0000 mg | ORAL_TABLET | Freq: Three times a day (TID) | ORAL | Status: DC | PRN

## 2022-09-12 MED ORDER — ONDANSETRON HCL 4 MG/2ML IJ SOLN
INTRAMUSCULAR | Status: AC
  Filled 2022-09-12: qty 2

## 2022-09-12 MED ORDER — DEXMEDETOMIDINE HCL IN NACL 80 MCG/20ML IV SOLN
INTRAVENOUS | Status: DC | PRN
  Administered 2022-09-12 (×2): 8 ug via BUCCAL
  Administered 2022-09-12: 4 ug via BUCCAL

## 2022-09-12 MED ORDER — FLEET ENEMA 7-19 GM/118ML RE ENEM: 1.0000 | ENEMA | Freq: Once | RECTAL | Status: DC | PRN

## 2022-09-12 MED ORDER — ONDANSETRON HCL 4 MG PO TABS: 4.0000 mg | ORAL_TABLET | Freq: Four times a day (QID) | ORAL | Status: DC | PRN

## 2022-09-12 MED ORDER — LACTATED RINGERS IV SOLN: INTRAVENOUS | Status: DC

## 2022-09-12 MED ORDER — SODIUM CHLORIDE (PF) 0.9 % IJ SOLN
INTRAMUSCULAR | Status: AC
  Filled 2022-09-12: qty 10

## 2022-09-12 MED ORDER — METHYLPREDNISOLONE ACETATE 80 MG/ML IJ SUSP
INTRAMUSCULAR | Status: DC | PRN
  Administered 2022-09-12: 40 mg

## 2022-09-12 MED ORDER — CHLORHEXIDINE GLUCONATE 0.12 % MT SOLN
OROMUCOSAL | Status: AC
  Administered 2022-09-12: 15 mL via OROMUCOSAL
  Filled 2022-09-12: qty 15

## 2022-09-12 MED ORDER — 0.9 % SODIUM CHLORIDE (POUR BTL) OPTIME
TOPICAL | Status: DC | PRN
  Administered 2022-09-12: 1000 mL

## 2022-09-12 MED ORDER — ACETAMINOPHEN 10 MG/ML IV SOLN
INTRAVENOUS | Status: AC
  Filled 2022-09-12: qty 100

## 2022-09-12 MED ORDER — OXYCODONE HCL 5 MG PO TABS
5.0000 mg | ORAL_TABLET | ORAL | Status: DC | PRN
  Administered 2022-09-12 – 2022-09-13 (×3): 5 mg via ORAL
  Filled 2022-09-12 (×3): qty 1

## 2022-09-12 MED ORDER — CEFAZOLIN SODIUM-DEXTROSE 2-4 GM/100ML-% IV SOLN
2.0000 g | Freq: Three times a day (TID) | INTRAVENOUS | Status: AC
  Administered 2022-09-12 – 2022-09-13 (×3): 2 g via INTRAVENOUS
  Filled 2022-09-12 (×3): qty 100

## 2022-09-12 MED ORDER — LIDOCAINE 2% (20 MG/ML) 5 ML SYRINGE
INTRAMUSCULAR | Status: DC | PRN
  Administered 2022-09-12: 100 mg via INTRAVENOUS

## 2022-09-12 MED ORDER — SUGAMMADEX SODIUM 200 MG/2ML IV SOLN
INTRAVENOUS | Status: DC | PRN
  Administered 2022-09-12: 250 mg via INTRAVENOUS

## 2022-09-12 MED ORDER — PROPOFOL 10 MG/ML IV BOLUS
INTRAVENOUS | Status: DC | PRN
  Administered 2022-09-12: 200 mg via INTRAVENOUS

## 2022-09-12 MED ORDER — BUPIVACAINE HCL (PF) 0.5 % IJ SOLN
INTRAMUSCULAR | Status: AC
  Filled 2022-09-12: qty 30

## 2022-09-12 MED ORDER — CEFAZOLIN SODIUM 1 G IJ SOLR
INTRAMUSCULAR | Status: AC
  Filled 2022-09-12: qty 10

## 2022-09-12 MED ORDER — OXYCODONE HCL 5 MG PO TABS
10.0000 mg | ORAL_TABLET | ORAL | Status: DC | PRN
  Administered 2022-09-12: 10 mg via ORAL
  Filled 2022-09-12: qty 2

## 2022-09-12 MED ORDER — DEXTROSE 5 % IV SOLN
INTRAVENOUS | Status: DC | PRN
  Administered 2022-09-12: 3 g via INTRAVENOUS

## 2022-09-12 SURGICAL SUPPLY — 59 items
BAG COUNTER SPONGE SURGICOUNT (BAG) ×1 IMPLANT
BAND RUBBER #18 3X1/16 STRL (MISCELLANEOUS) ×2 IMPLANT
BENZOIN TINCTURE PRP APPL 2/3 (GAUZE/BANDAGES/DRESSINGS) IMPLANT
BLADE CLIPPER SURG (BLADE) IMPLANT
BUR CARBIDE MATCH 3.0 (BURR) IMPLANT
BUR SURGICAL HEAD PROX 3X12.5 (BURR) ×1 IMPLANT
BURR SURGICAL HEAD PROX 3X12.5 (BURR)
CANISTER SUCT 3000ML PPV (MISCELLANEOUS) ×1 IMPLANT
DERMABOND ADVANCED .7 DNX12 (GAUZE/BANDAGES/DRESSINGS) IMPLANT
DRAPE C-ARM 42X72 X-RAY (DRAPES) ×2 IMPLANT
DRAPE LAPAROTOMY 100X72X124 (DRAPES) ×1 IMPLANT
DRAPE MICROSCOPE SLANT 54X150 (MISCELLANEOUS) ×1 IMPLANT
DRAPE SURG 17X23 STRL (DRAPES) ×1 IMPLANT
DRSG MEPILEX BORDER 4X4 (GAUZE/BANDAGES/DRESSINGS) ×1 IMPLANT
DRSG OPSITE POSTOP 3X4 (GAUZE/BANDAGES/DRESSINGS) IMPLANT
DURAPREP 26ML APPLICATOR (WOUND CARE) ×1 IMPLANT
ELECT BLADE INSULATED 4IN (ELECTROSURGICAL) ×1
ELECT REM PT RETURN 9FT ADLT (ELECTROSURGICAL) ×1
ELECTRODE BLADE INSULATED 4IN (ELECTROSURGICAL) ×1 IMPLANT
ELECTRODE REM PT RTRN 9FT ADLT (ELECTROSURGICAL) ×1 IMPLANT
GAUZE 4X4 16PLY ~~LOC~~+RFID DBL (SPONGE) IMPLANT
GAUZE SPONGE 4X4 12PLY STRL (GAUZE/BANDAGES/DRESSINGS) IMPLANT
GLOVE BIOGEL PI IND STRL 7.5 (GLOVE) ×2 IMPLANT
GLOVE ECLIPSE 7.5 STRL STRAW (GLOVE) ×1 IMPLANT
GLOVE INDICATOR 7.0 STRL GRN (GLOVE) IMPLANT
GLOVE SURG ENC MOIS LTX SZ8 (GLOVE) ×1 IMPLANT
GLOVE SURG SS PI 6.5 STRL IVOR (GLOVE) IMPLANT
GLOVE SURG UNDER POLY LF SZ8.5 (GLOVE) ×1 IMPLANT
GOWN STRL REUS W/ TWL LRG LVL3 (GOWN DISPOSABLE) ×2 IMPLANT
GOWN STRL REUS W/ TWL XL LVL3 (GOWN DISPOSABLE) ×1 IMPLANT
GOWN STRL REUS W/TWL 2XL LVL3 (GOWN DISPOSABLE) IMPLANT
GOWN STRL REUS W/TWL LRG LVL3 (GOWN DISPOSABLE) ×2
GOWN STRL REUS W/TWL XL LVL3 (GOWN DISPOSABLE) ×1
HEMOSTAT POWDER KIT SURGIFOAM (HEMOSTASIS) ×1 IMPLANT
IV CATH AUTO 14GX1.75 SAFE ORG (IV SOLUTION) ×1 IMPLANT
KIT BASIN OR (CUSTOM PROCEDURE TRAY) ×1 IMPLANT
KIT TURNOVER KIT B (KITS) ×1 IMPLANT
NDL HYPO 18GX1.5 BLUNT FILL (NEEDLE) IMPLANT
NDL HYPO 25X1 1.5 SAFETY (NEEDLE) ×1 IMPLANT
NDL SPNL 18GX3.5 QUINCKE PK (NEEDLE) IMPLANT
NEEDLE HYPO 18GX1.5 BLUNT FILL (NEEDLE) ×1 IMPLANT
NEEDLE HYPO 25X1 1.5 SAFETY (NEEDLE) ×1 IMPLANT
NEEDLE SPNL 18GX3.5 QUINCKE PK (NEEDLE) IMPLANT
NS IRRIG 1000ML POUR BTL (IV SOLUTION) ×1 IMPLANT
PACK LAMINECTOMY NEURO (CUSTOM PROCEDURE TRAY) ×1 IMPLANT
PAD ARMBOARD 7.5X6 YLW CONV (MISCELLANEOUS) ×3 IMPLANT
SPIKE FLUID TRANSFER (MISCELLANEOUS) ×1 IMPLANT
SPONGE SURGIFOAM ABS GEL SZ50 (HEMOSTASIS) ×1 IMPLANT
SPONGE T-LAP 4X18 ~~LOC~~+RFID (SPONGE) IMPLANT
STRIP CLOSURE SKIN 1/2X4 (GAUZE/BANDAGES/DRESSINGS) IMPLANT
SUT MNCRL AB 4-0 PS2 18 (SUTURE) ×1 IMPLANT
SUT VIC AB 0 CT1 18XCR BRD8 (SUTURE) IMPLANT
SUT VIC AB 0 CT1 8-18 (SUTURE) ×1
SUT VIC AB 2-0 CP2 18 (SUTURE) ×1 IMPLANT
SUT VIC AB 3-0 SH 8-18 (SUTURE) ×1 IMPLANT
SYR 3ML LL SCALE MARK (SYRINGE) IMPLANT
TOWEL GREEN STERILE (TOWEL DISPOSABLE) ×1 IMPLANT
TOWEL GREEN STERILE FF (TOWEL DISPOSABLE) ×1 IMPLANT
WATER STERILE IRR 1000ML POUR (IV SOLUTION) ×1 IMPLANT

## 2022-09-12 NOTE — Progress Notes (Signed)
Neurosurgery.  Patient seen and examined. He reports no changes in his symptoms. He has 4-/5 R DF, 2/5 R EHL, + SLR  - Plan for microdiscectomy today. All questions are concerns were answered. Informed consent was obtained.

## 2022-09-12 NOTE — Anesthesia Preprocedure Evaluation (Addendum)
Anesthesia Evaluation  Patient identified by MRN, date of birth, ID band Patient awake    Reviewed: Allergy & Precautions, NPO status , Patient's Chart, lab work & pertinent test results  History of Anesthesia Complications Negative for: history of anesthetic complications  Airway Mallampati: III  TM Distance: >3 FB Neck ROM: Full    Dental no notable dental hx.    Pulmonary neg shortness of breath, neg sleep apnea, neg COPD, neg recent URI, Current Smoker and Patient abstained from smoking.   Pulmonary exam normal breath sounds clear to auscultation       Cardiovascular negative cardio ROS  Rhythm:Regular Rate:Normal     Neuro/Psych neg Seizures  Neuromuscular disease (lumbar radiculopathy)    GI/Hepatic negative GI ROS, Neg liver ROS,,,  Endo/Other  neg diabetes  Morbid obesity  Renal/GU negative Renal ROS     Musculoskeletal   Abdominal  (+) + obese  Peds  Hematology negative hematology ROS (+)   Anesthesia Other Findings 31 y.o. male presents with worsening right leg sciatica.  He has had back and pain radiating down his right leg for the past several months.  PT and pain management failed to resolve his symptoms.  He then went to [facility] and received an ESI which helped only for a few days. He came to the ER when he felt more weakness in his leg.  Reproductive/Obstetrics                             Anesthesia Physical Anesthesia Plan  ASA: 3  Anesthesia Plan: General   Post-op Pain Management: Ofirmev IV (intra-op)*   Induction: Intravenous  PONV Risk Score and Plan: 1 and Ondansetron and Dexamethasone  Airway Management Planned: Oral ETT  Additional Equipment:   Intra-op Plan:   Post-operative Plan:   Informed Consent: I have reviewed the patients History and Physical, chart, labs and discussed the procedure including the risks, benefits and alternatives for  the proposed anesthesia with the patient or authorized representative who has indicated his/her understanding and acceptance.     Dental advisory given and Interpreter used for interveiw (Spanish interpreter ID# [phone])  Plan Discussed with: CRNA and Anesthesiologist  Anesthesia Plan Comments: (Risks of general anesthesia discussed including, but not limited to, sore throat, hoarse voice, chipped/damaged teeth, injury to vocal cords, nausea and vomiting, allergic reactions, lung infection, heart attack, stroke, and death. All questions answered. )       Anesthesia Quick Evaluation

## 2022-09-12 NOTE — Anesthesia Postprocedure Evaluation (Signed)
Anesthesia Post Note  Patient: Lamorris Francisco Kozlowski  Procedure(s) Performed: LUMBAR LAMINECTOMY/ DECOMPRESSION WITH MET-REX, LUMBAR FOUR-FIVE (Right)     Patient location during evaluation: PACU Anesthesia Type: General Level of consciousness: awake Pain management: pain level controlled Vital Signs Assessment: post-procedure vital signs reviewed and stable Respiratory status: spontaneous breathing, nonlabored ventilation and respiratory function stable Cardiovascular status: blood pressure returned to baseline and stable Postop Assessment: no apparent nausea or vomiting Anesthetic complications: no   No notable events documented.  Last Vitals:  Vitals:   09/12/22 1345 09/12/22 1400  BP: 114/69 113/75  Pulse: 89 78  Resp: 19 12  Temp:    SpO2: 94% 97%    Last Pain:  Vitals:   09/12/22 1255  TempSrc:   PainSc: 0-No pain                 Jennifer Dickerson Allan      

## 2022-09-12 NOTE — Transfer of Care (Signed)
Immediate Anesthesia Transfer of Care Note  Patient: Marc York  Procedure(s) Performed: LUMBAR LAMINECTOMY/ DECOMPRESSION WITH MET-REX, LUMBAR FOUR-FIVE (Right)  Patient Location: PACU  Anesthesia Type:General  Level of Consciousness: awake, alert , oriented, and patient cooperative  Airway & Oxygen Therapy: Patient Spontanous Breathing and Patient connected to face mask oxygen  Post-op Assessment: Report given to RN and Post -op Vital signs reviewed and stable  Post vital signs: Reviewed and stable  Last Vitals:  Vitals Value Taken Time  BP 110/85 09/12/22 1255  Temp    Pulse 92 09/12/22 1258  Resp    SpO2 93 % 09/12/22 1258  Vitals shown include unvalidated device data.  Last Pain:  Vitals:   09/12/22 0940  TempSrc: Oral  PainSc: 0-No pain      Patients Stated Pain Goal: 2 (09/17/74 8832)  Complications: No notable events documented.

## 2022-09-12 NOTE — Anesthesia Procedure Notes (Signed)
Procedure Name: Intubation Date/Time: 09/12/2022 10:22 AM  Performed by: Colin Benton, CRNAPre-anesthesia Checklist: Patient identified, Emergency Drugs available, Suction available and Patient being monitored Patient Re-evaluated:Patient Re-evaluated prior to induction Oxygen Delivery Method: Circle system utilized Preoxygenation: Pre-oxygenation with 100% oxygen Induction Type: IV induction Ventilation: Mask ventilation without difficulty and Oral airway inserted - appropriate to patient size Laryngoscope Size: Glidescope and 4 Grade View: Grade I Tube type: Oral Tube size: 7.5 mm Number of attempts: 3 Airway Equipment and Method: Oral airway, Rigid stylet and Video-laryngoscopy Placement Confirmation: ETT inserted through vocal cords under direct vision, positive ETCO2 and breath sounds checked- equal and bilateral Secured at: 23 cm Tube secured with: Tape Dental Injury: Teeth and Oropharynx as per pre-operative assessment  Comments: DL x 1 with Mil 2 and grade 3 view.  DL x 2 with MAC 4 and grade 3 view. Dr. Zenia Resides attempted with continued poor visualization.  Glidescope 4 used and EBBS and VSS.

## 2022-09-12 NOTE — Op Note (Signed)
PREOP DIAGNOSIS: Herniated nucleus pulposus at right L4-5 with radiculopathy  POSTOP DIAGNOSIS: Herniated nucleus pulposus at right L4-5 with radiculopathy  PROCEDURE: 1. Right L4-5 laminotomy, medial facetectomy for excision of herniated disc using tubular retractor system 2.  Use of microscope for microdissection  SURGEON: Dr. Hoyt Koch, MD  ASSISTANT: none   ANESTHESIA: General Endotracheal  EBL: 50 ml  SPECIMENS: None  DRAINS: None  COMPLICATIONS: none  CONDITION: Stable to PCAU  HISTORY: Marc York is a 31 y.o. male who presented to the ER with progressively worsening right lumbar radiculopathy with leg and foot weakness. MRI showed multilevel degenerative disc disease and bulging as well as large right L4-5 disc herniation with inferior extrusion.  The patient had failed numerous nonsurgical management modalities and had weakness.  Therefore, microdiscectomy was offered to the patient.  Risks, benefits, alternatives, and expected convalescence were discussed.  Risks discussed included, but were not limited to, bleeding, pain, infection, scar, recurrent disc, instability, CSF leak, weakness, numbness, paralysis, and death.  Informed consent was obtained and the patient wished to proceed.  PROCEDURE IN DETAIL: The patient was brought to the operating room. After induction of general anesthesia, the patient was positioned on the operative table in the prone position on a Wilson frame with all pressure points meticulously padded. The skin of the low back was then prepped and draped in the usual sterile fashion.  Under fluoroscopy, the correct levels were identified and marked out on the skin, and after timeout was conducted, the skin was infiltrated with local anesthetic.  Paramedian skin incision was then made sharply over the affected level and the subcutaneous tissue and fascia were incised.  Initial dilator was then passed to the interspace under  fluoroscopic guidance.  The paraspinous muscle attachments were dissected from the L4 lamina using the dilators.  Successive dilators were used until a 20 mm tubular retractor was placed under fluoroscopic guidance and locked in place with an attachment arm.  Microscope was then introduced into the field.  Small amount of remaining musculature was dissected from the lamina and the interspace was visualized as well.  The medial facet was also exposed.  High-speed drill was used to perform a laminotomy as well as a modest medial facetectomy.  The ligamentum flavum was then removed with rongeurs.  The thecal sac and traversing nerve root were identified.  The epidural disc space was dissected with a 4 Penfield.  Upon retracting the nerve root medially, the large disc herniation was encountered.  The disc herniation was incised with a 15 blade and pituitary rongeur was used to remove the large disc fragment.  Very large inferior extruded fragment was retrieved. Smaller additional fragments in the disc space were also removed.  Following removal of the herniated disc, the nerve root appeared much more relaxed.  Woodson probe was used to ensure good decompression and there was no longer significant's impingement of the nerve roots.  Meticulous hemostasis was obtained.  The wound was irrigated thoroughly with bacitracin impregnated irrigation.  Depo-Medrol was then placed over the nerve root.  The tubular retractor was then withdrawn with hemostasis in the muscle obtained with bipolar.  The muscles were injected with half percent Marcaine.  The fascia was closed with 0 Vicryl stitches.  The dermal layer was closed with 2-0 Vicryl stitches in buried interrupted fashion.  The skin was closed with 4-0 Monocryl in subcuticular manner followed by Dermabond.  A sterile dressing was placed.  Patient was then flipped supine and  extubated by the anesthesia service.  All counts were correct at the end of surgery.  No complications  were noted.

## 2022-09-13 ENCOUNTER — Encounter (HOSPITAL_COMMUNITY): Payer: Self-pay | Admitting: Neurosurgery

## 2022-09-13 LAB — GLUCOSE, CAPILLARY: Glucose-Capillary: 116 mg/dL — ABNORMAL HIGH (ref 70–99)

## 2022-09-13 MED ORDER — OXYCODONE-ACETAMINOPHEN 5-325 MG PO TABS: 1.0000 | ORAL_TABLET | Freq: Four times a day (QID) | ORAL | 0 refills | Status: AC | PRN

## 2022-09-13 MED ORDER — DOCUSATE SODIUM 100 MG PO CAPS: 100.0000 mg | ORAL_CAPSULE | Freq: Two times a day (BID) | ORAL | 2 refills | Status: AC

## 2022-09-13 MED ORDER — CYCLOBENZAPRINE HCL 10 MG PO TABS: 10.0000 mg | ORAL_TABLET | Freq: Three times a day (TID) | ORAL | 0 refills | Status: AC | PRN

## 2022-09-13 MED ORDER — ORAL CARE MOUTH RINSE: 15.0000 mL | OROMUCOSAL | Status: DC | PRN

## 2022-09-13 NOTE — Evaluation (Signed)
Occupational Therapy Evaluation & Discharge Patient Details Name: Marc York MRN: [id number] DOB: 1991/02/12 Today's Date: 09/13/2022   History of Present Illness Patient is a 31 y/o male who presents on 12/11 with worsening RLE sciatica, making it difficult to ambulate. s/p Right sided L4-5 laminectomy and decompression 12/13.   Clinical Impression   Pt received in recliner with no pain reported. Pt able to recall back precautions and log roll technique for bed mobility via verbal talk through. Pt then able to mobilize into bathroom, transfer to toilet, demo step over small ledge to simulate walk in shower with MOD I. In hallway pt able to weave between obstacles, walk forwards with variable speed/gaze, and walk backwards/laterally with MOD I. Pt educated on oral care using 2 cup method as well as strategies for picking up items off the floor with reacher or mini squat technique. Pt left in recliner at end of session      Recommendations for follow up therapy are one component of a multi-disciplinary discharge planning process, led by the attending physician.  Recommendations may be updated based on patient status, additional functional criteria and insurance authorization.   Follow Up Recommendations  No OT follow up     Assistance Recommended at Discharge None  Patient can return home with the following      Functional Status Assessment  Patient has not had a recent decline in their functional status  Equipment Recommendations  None recommended by OT    Recommendations for Other Services       Precautions / Restrictions Precautions Precautions: Back Precaution Booklet Issued: Yes (comment) Precaution Comments: Reviewed precautions and handout already provided Restrictions Weight Bearing Restrictions: No      Mobility Bed Mobility Overal bed mobility: Modified Independent             General bed mobility comments: able to recall log roll  technique    Transfers Overall transfer level: Modified independent Equipment used: None               General transfer comment: Stood from recliner without difficulty      Balance   Independent for sitting and standing balance       ADL either performed or assessed with clinical judgement   ADL Overall ADL's : Modified independent     General ADL Comments: increased time, education on use of figure 4 for dressing     Vision Baseline Vision/History: 0 No visual deficits Ability to See in Adequate Light: 0 Adequate Patient Visual Report: No change from baseline              Pertinent Vitals/Pain Pain Assessment Pain Assessment: No/denies pain     Hand Dominance Right   Extremity/Trunk Assessment Upper Extremity Assessment Upper Extremity Assessment: Overall WFL for tasks assessed   Lower Extremity Assessment Lower Extremity Assessment: Overall WFL for tasks assessed;RLE deficits/detail RLE Deficits / Details: some numbness in toes, improved from before surgery RLE Sensation: decreased light touch   Cervical / Trunk Assessment Cervical / Trunk Assessment: Back Surgery   Communication Communication Communication: No difficulties   Cognition Arousal/Alertness: Awake/alert Behavior During Therapy: WFL for tasks assessed/performed Overall Cognitive Status: Within Functional Limits for tasks assessed       General Comments  worked on functional mobility in hallway, weaving, pacing walking speed, looking in different directions, reviewed picking item up off the floor            Home Living Family/patient expects to be discharged  to:: Private residence Living Arrangements: Spouse/significant other Available Help at Discharge: Family;Available PRN/intermittently Type of Home: House Home Access: Level entry     Home Layout: One level     Bathroom Shower/Tub: Producer, television/film/video: Standard     Home Equipment: None           Prior Functioning/Environment Prior Level of Function : Independent/Modified Independent       Mobility Comments: independent, farmer, physical job ADLs Comments: independent                OT Goals(Current goals can be found in the care plan section) Acute Rehab OT Goals Patient Stated Goal: to go  home OT Goal Formulation: With patient Potential to Achieve Goals: Good   AM-PAC OT "6 Clicks" Daily Activity     Outcome Measure Help from another person eating meals?: None Help from another person taking care of personal grooming?: None Help from another person toileting, which includes using toliet, bedpan, or urinal?: None Help from another person bathing (including washing, rinsing, drying)?: None Help from another person to put on and taking off regular upper body clothing?: None Help from another person to put on and taking off regular lower body clothing?: None 6 Click Score: 24   End of Session Equipment Utilized During Treatment: Gait belt  Activity Tolerance: Patient tolerated treatment well Patient left: in chair                   Time: 7711-6579 OT Time Calculation (min): 11 min Charges:  OT General Charges $OT Visit: 1 Visit OT Evaluation $OT Eval Low Complexity: 1 Low  Jeannette Maddy MOTR/L   Shon Hale 09/13/2022, 9:56 AM

## 2022-09-13 NOTE — Evaluation (Signed)
Physical Therapy Evaluation Patient Details Name: Marc York MRN: [id number] DOB: May 14, 1991 Today's Date: 09/13/2022  History of Present Illness  Patient is a 31 y/o male who presents on 12/11 with worsening RLE sciatica, making it difficult to ambulate. s/p Right sided L4-5 laminectomy and decompression 12/13.  Clinical Impression  Patient reports being independent for ADLs/IADLs and working PTA. Today, pt reports no pain and feeling good post surgery. Tolerated transfers, gait training and stair training mod I due to slightly decreased gait speed. No evidence of imbalance or difficulty. Provided back precautions handout in Spanish and educated re: log roll technique, positioning, walking program, car transfer, precautions, functional tasks etc. Pt plans to take some time of work. Does not require further skilled therapy services. All education completed. Encouraged walking hallways. Discharge from therapy.       Recommendations for follow up therapy are one component of a multi-disciplinary discharge planning process, led by the attending physician.  Recommendations may be updated based on patient status, additional functional criteria and insurance authorization.  Follow Up Recommendations No PT follow up      Assistance Recommended at Discharge PRN  Patient can return home with the following  Assist for transportation;Assistance with cooking/housework    Equipment Recommendations None recommended by PT  Recommendations for Other Services       Functional Status Assessment Patient has not had a recent decline in their functional status     Precautions / Restrictions Precautions Precautions: Back Precaution Booklet Issued: Yes (comment) Precaution Comments: Reviewed precautions and handout provided in Spanish Restrictions Weight Bearing Restrictions: No      Mobility  Bed Mobility Overal bed mobility: Modified Independent             General bed  mobility comments: Good demo of log roll technique to get to EOB with HOB 20 degrees.    Transfers Overall transfer level: Modified independent Equipment used: None               General transfer comment: Stood from EOB x1, from toilet x1 without difficulty.    Ambulation/Gait Ambulation/Gait assistance: Modified independent (Device/Increase time) Gait Distance (Feet): 550 Feet Assistive device: None Gait Pattern/deviations: Step-through pattern, Decreased stride length   Gait velocity interpretation: 1.31 - 2.62 ft/sec, indicative of limited community ambulator   General Gait Details: Slow, steady gait with wide BoS, no evidence of imbalance with head turns or changes in direction. No worsened pain.  Stairs Stairs: Yes Stairs assistance: Modified independent (Device/Increase time) Stair Management: One rail Left, Alternating pattern, Step to pattern Number of Stairs: 13 General stair comments: Cues for safety, starting with step to pattern progressing with step through pattern  Wheelchair Mobility    Modified Rankin (Stroke Patients Only)       Balance Overall balance assessment: No apparent balance deficits (not formally assessed)                                           Pertinent Vitals/Pain Pain Assessment Pain Assessment: No/denies pain    Home Living Family/patient expects to be discharged to:: Private residence Living Arrangements: Spouse/significant other Available Help at Discharge: Family;Available PRN/intermittently Type of Home: House Home Access: Level entry       Home Layout: One level Home Equipment: None      Prior Function Prior Level of Function : Independent/Modified Independent  Mobility Comments: independent, farmer, physical job ADLs Comments: independent     Hand Dominance   Dominant Hand: Right    Extremity/Trunk Assessment   Upper Extremity Assessment Upper Extremity Assessment:  Defer to OT evaluation    Lower Extremity Assessment Lower Extremity Assessment: RLE deficits/detail RLE Deficits / Details: some numbness in toes, improved from before surgery RLE Sensation: decreased light touch    Cervical / Trunk Assessment Cervical / Trunk Assessment: Back Surgery  Communication   Communication: No difficulties;Prefers language other than English (for written materal)  Cognition Arousal/Alertness: Awake/alert Behavior During Therapy: WFL for tasks assessed/performed Overall Cognitive Status: Within Functional Limits for tasks assessed                                          General Comments General comments (skin integrity, edema, etc.): Bandage- clean dry and intact, no weeping.    Exercises     Assessment/Plan    PT Assessment Patient does not need any further PT services  PT Problem List         PT Treatment Interventions      PT Goals (Current goals can be found in the Care Plan section)  Acute Rehab PT Goals Patient Stated Goal: to get back to work PT Goal Formulation: All assessment and education complete, DC therapy    Frequency       Co-evaluation               AM-PAC PT "6 Clicks" Mobility  Outcome Measure Help needed turning from your back to your side while in a flat bed without using bedrails?: None Help needed moving from lying on your back to sitting on the side of a flat bed without using bedrails?: None Help needed moving to and from a bed to a chair (including a wheelchair)?: None Help needed standing up from a chair using your arms (e.g., wheelchair or bedside chair)?: None Help needed to walk in hospital room?: None Help needed climbing 3-5 steps with a railing? : None 6 Click Score: 24    End of Session   Activity Tolerance: Patient tolerated treatment well Patient left: in bed;with call bell/phone within reach Nurse Communication: Mobility status PT Visit Diagnosis: Difficulty in walking,  not elsewhere classified (R26.2)    Time: 8242-3536 PT Time Calculation (min) (ACUTE ONLY): 15 min   Charges:   PT Evaluation $PT Eval Low Complexity: 1 Low          Vale Haven, PT, DPT Acute Rehabilitation Services Secure chat preferred Office [phone]     Blake Divine A Bryer Cozzolino 09/13/2022, 10:46 AM

## 2022-09-13 NOTE — Discharge Summary (Signed)
  Physician Discharge Summary  Patient ID: Marc York MRN: [id number] DOB/AGE: 33-Jul-1992 33 y.o.  Admit date: 09/09/2022 Discharge date: 09/13/2022  Admission Diagnoses:  Lumbar radiculopathy  Discharge Diagnoses:  Same Principal Problem:   Back pain Active Problems:   Lumbar radiculopathy   Discharged Condition: Stable  Hospital Course:  Marc York is a 31 y.o. male with morbid obesity who presented to the hospital with worsening radiculopathy with leg weakness.  He underwent L4-5 microdiscectomy.  Postoperatively, he recovered on the floor and he reported good resolution of his radiculopathy symptoms.  His pain was well-controlled with p.o. pain medications.  He was mobilized with help with PT and OT who did not recommend any further therapies.  He was deemed ready for discharge home on 09/13/2022.  Treatments: Surgery -L4-5 microdiscectomy  Discharge Exam: Blood pressure 109/71, pulse 86, temperature 98 F (36.7 C), temperature source Oral, resp. rate 18, height 5\' 7"  (1.702 m), weight 124.7 kg, SpO2 97 %. Awake, alert, oriented Speech fluent, appropriate CN grossly intact 5/5 BUE/BLE Wound c/d/i  Disposition: Discharge disposition: 01-Home or Self Care       Discharge Instructions     Incentive spirometry RT   Complete by: As directed       Allergies as of 09/13/2022   No Known Allergies      Medication List     STOP taking these medications    traMADol 50 MG tablet Commonly known as: ULTRAM       TAKE these medications    cyclobenzaprine 10 MG tablet Commonly known as: FLEXERIL Take 1 tablet (10 mg total) by mouth 3 (three) times daily as needed for muscle spasms. What changed:  how to take this when to take this   docusate sodium 100 MG capsule Commonly known as: Colace Take 1 capsule (100 mg total) by mouth 2 (two) times daily.   gabapentin 300 MG capsule Commonly known as: NEURONTIN Take  300 mg by mouth 2 (two) times daily.   ibuprofen 600 MG tablet Commonly known as: ADVIL Take 600 mg by mouth every 8 (eight) hours as needed for moderate pain.   oxyCODONE-acetaminophen 5-325 MG tablet Commonly known as: Percocet Take 1 tablet by mouth every 6 (six) hours as needed for severe pain.        Follow-up Information     09/15/2022, MD Follow up in 2 week(s).   Specialty: Neurosurgery Contact information: [address] [place] [phone]                 Signed: [phone] 09/13/2022, 12:54 PM

## 2022-09-13 NOTE — Plan of Care (Signed)
  Problem: Activity: Goal: Ability to avoid complications of mobility impairment will improve Outcome: Not Progressing   Problem: Activity: Goal: Will remain free from falls Outcome: Not Progressing   Problem: Activity: Goal: Ability to tolerate increased activity will improve Outcome: Not Progressing

## 2022-09-13 NOTE — Discharge Instructions (Signed)
Can remove transparent dressing and shower 48 hours after surgery Walk as much as possible No heavy lifting >10 lbs No excessive bending/twisting at the waist
